# Patient Record
Sex: Male | Born: 1965 | Race: Black or African American | Hispanic: No | Marital: Single | Smoking: Current every day smoker
Health system: Southern US, Community
[De-identification: ages and names within clinical notes are randomized; demographics above are authoritative.]

## PROBLEM LIST (undated history)

## (undated) DIAGNOSIS — E119 Type 2 diabetes mellitus without complications: Secondary | ICD-10-CM

## (undated) DIAGNOSIS — I10 Essential (primary) hypertension: Secondary | ICD-10-CM

---

## 2010-12-27 ENCOUNTER — Emergency Department (HOSPITAL_BASED_OUTPATIENT_CLINIC_OR_DEPARTMENT_OTHER)
Admission: EM | Admit: 2010-12-27 | Discharge: 2010-12-27 | Disposition: A | Discharge status: Home / Self Care | Payer: Worker's Compensation | Attending: Emergency Medicine | Admitting: Emergency Medicine

## 2010-12-27 ENCOUNTER — Emergency Department (INDEPENDENT_AMBULATORY_CARE_PROVIDER_SITE_OTHER): Payer: Worker's Compensation

## 2010-12-27 DIAGNOSIS — S63509A Unspecified sprain of unspecified wrist, initial encounter: Secondary | ICD-10-CM | POA: Insufficient documentation

## 2010-12-27 DIAGNOSIS — I10 Essential (primary) hypertension: Secondary | ICD-10-CM | POA: Insufficient documentation

## 2010-12-27 DIAGNOSIS — F17200 Nicotine dependence, unspecified, uncomplicated: Secondary | ICD-10-CM | POA: Insufficient documentation

## 2010-12-27 DIAGNOSIS — M7989 Other specified soft tissue disorders: Secondary | ICD-10-CM

## 2010-12-27 DIAGNOSIS — M25539 Pain in unspecified wrist: Secondary | ICD-10-CM

## 2010-12-27 DIAGNOSIS — Y9289 Other specified places as the place of occurrence of the external cause: Secondary | ICD-10-CM | POA: Insufficient documentation

## 2010-12-27 DIAGNOSIS — X58XXXA Exposure to other specified factors, initial encounter: Secondary | ICD-10-CM | POA: Insufficient documentation

## 2011-12-04 IMAGING — CR DG WRIST COMPLETE 3+V*R*
4 series · 4 of 4 positions shown · non-contrast
Comparison: None

CLINICAL DATA: Medial pain

RIGHT WRIST - COMPLETE 3+ VIEW

[x wrist pa right]
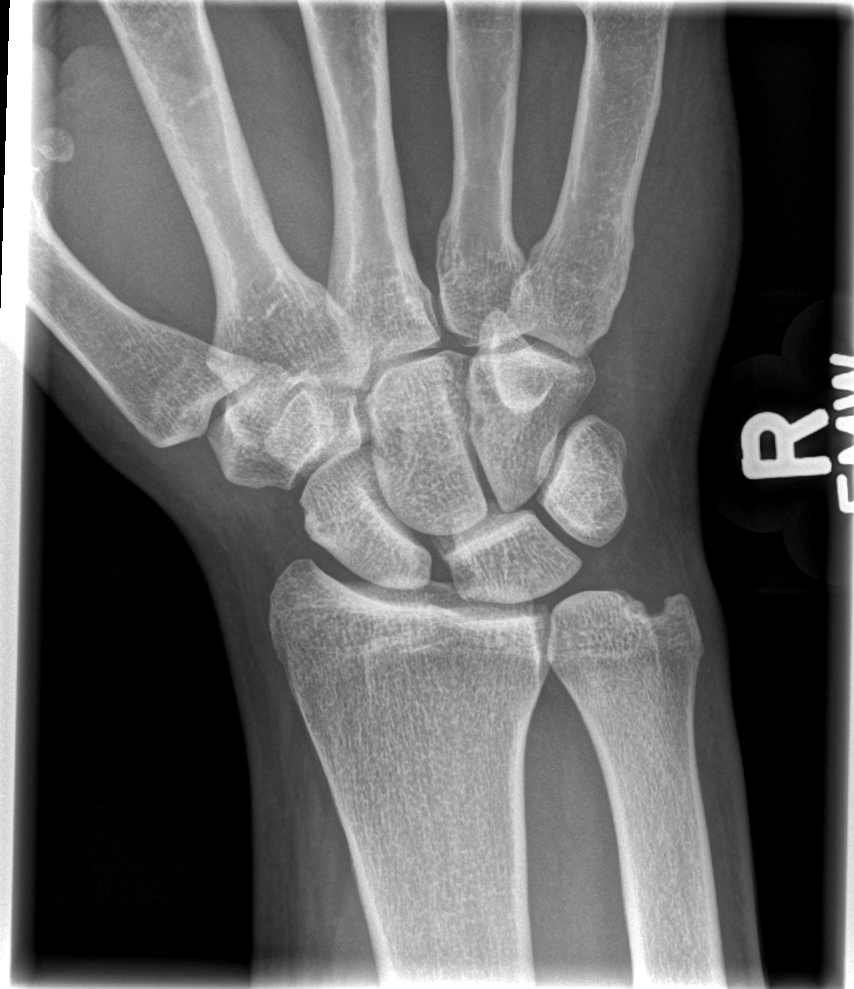

[x wrist obl right]
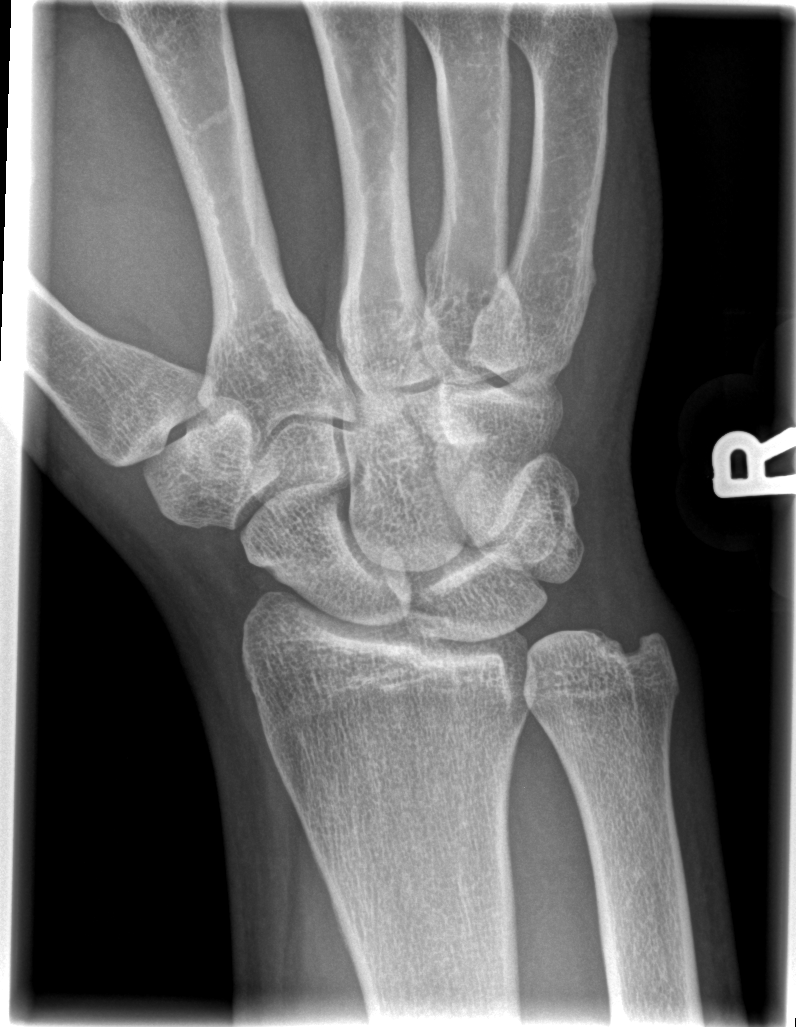

[x wrist lat right]
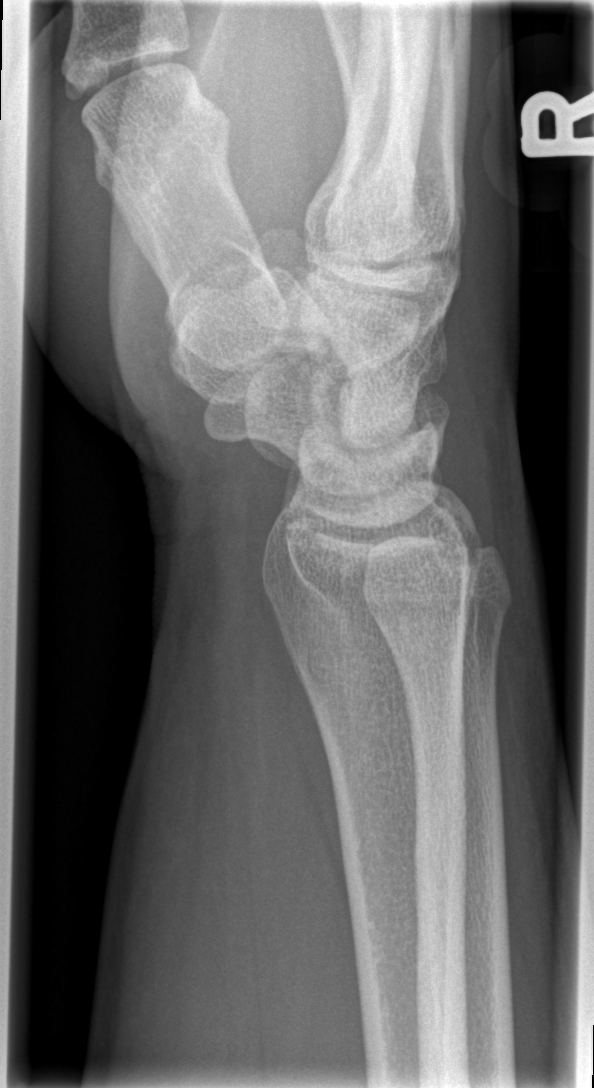

[x navicular]
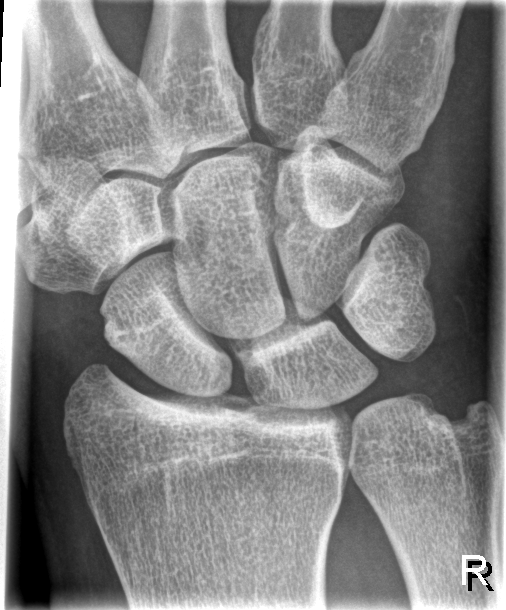

[4 of 4 positions shown; findings below may reference images not displayed]

FINDINGS: No evidence of fracture or dislocation.  There is slight
widening of the space between the lunate and the scaphoid that
could indicate a ligamentous injury.
IMPRESSION: No evidence of fracture.  Question scapholunate ligament tear, age
indeterminate.

## 2016-10-12 ENCOUNTER — Encounter (HOSPITAL_COMMUNITY): Payer: Self-pay | Admitting: *Deleted

## 2016-10-12 ENCOUNTER — Emergency Department (HOSPITAL_COMMUNITY)
Admission: EM | Admit: 2016-10-12 | Discharge: 2016-10-12 | Disposition: A | Discharge status: Home / Self Care | Payer: Worker's Compensation | Attending: Emergency Medicine | Admitting: Emergency Medicine

## 2016-10-12 DIAGNOSIS — E119 Type 2 diabetes mellitus without complications: Secondary | ICD-10-CM | POA: Insufficient documentation

## 2016-10-12 DIAGNOSIS — R030 Elevated blood-pressure reading, without diagnosis of hypertension: Secondary | ICD-10-CM

## 2016-10-12 DIAGNOSIS — I10 Essential (primary) hypertension: Secondary | ICD-10-CM | POA: Insufficient documentation

## 2016-10-12 DIAGNOSIS — F17200 Nicotine dependence, unspecified, uncomplicated: Secondary | ICD-10-CM | POA: Insufficient documentation

## 2016-10-12 DIAGNOSIS — J069 Acute upper respiratory infection, unspecified: Secondary | ICD-10-CM | POA: Insufficient documentation

## 2016-10-12 HISTORY — DX: Essential (primary) hypertension: I10

## 2016-10-12 HISTORY — DX: Type 2 diabetes mellitus without complications: E11.9

## 2016-10-12 NOTE — ED Notes (Signed)
Pt is hypertensive at triage. Has been without his meds x 6 months.

## 2016-10-12 NOTE — ED Notes (Signed)
Patient states he is getting over cold from last week and the sore throat began after he had some drainage.

## 2016-10-12 NOTE — ED Triage Notes (Signed)
Pt reports recent cough and cold symptoms. Now has left side sore throat. Airway intact, no obv swelling noted.

## 2016-10-12 NOTE — ED Provider Notes (Signed)
[facility]-EMERGENCY DEPT Provider Note   By signing my name below, I, Earmon PhoenixJennifer Waddell, attest that this documentation has been prepared under the direction and in the presence of Lorretta HarpFrank Teesha Ohm, PA-C. Electronically Signed: Earmon PhoenixJennifer Waddell, ED Scribe. 10/12/16. 10:39 AM.    History   Chief Complaint Chief Complaint  Patient presents with  . Sore Throat  . Hypertension   The history is provided by the patient and medical records. No language interpreter was used.    Ricky StabsMarty A Hollyfield is a 51 y.o. male with PMHx of DM, HTN who presents to the Emergency Department complaining of sore throat that began two days ago. He states he is having radiation of pain to left ear. He reports associated productive cough of green mucous that is now improving. He has has taken Alka Seltzer Cold & Flu and cough drops for relief. Swallowing increases his pain. He denies alleviating factors. Pt states his granddaughter has been sick with similar symptoms. He denies fever, chills, abdominal pain, nausea, vomiting, difficulty breathing or inability to swallow, CP, drooling. He also presents with apparent hypertension at triage and has been off his BP medications for the last 6 months. Pt has a PCP but does not currently have insurance.   Past Medical History:  Diagnosis Date  . Diabetes mellitus without complication (HCC)   . Hypertension     There are no active problems to display for this patient.   History reviewed. No pertinent surgical history.     Home Medications    Prior to Admission medications   Not on File    Family History History reviewed. No pertinent family history.  Social History Social History  Substance Use Topics  . Smoking status: Current Every Day Smoker  . Smokeless tobacco: Not on file  . Alcohol use Yes     Comment: rarely     Allergies   Patient has no known allergies.   Review of Systems Review of Systems  Constitutional: Negative for chills and fever.  HENT:  Positive for congestion, ear pain and sore throat. Negative for drooling and trouble swallowing.   Respiratory: Positive for cough. Negative for shortness of breath.   Cardiovascular: Negative for chest pain.  Gastrointestinal: Negative for abdominal pain, nausea and vomiting.     Physical Exam Updated Vital Signs BP (!) 194/106 (BP Location: Left Arm) Comment: Pt states has not been taking HTN meds  Pulse 97   Temp 98.8 F (37.1 C) (Oral)   Resp 18   Ht 6\' 2"  (1.88 m)   Wt 218 lb (98.9 kg)   SpO2 98%   BMI 27.99 kg/m   Physical Exam  Constitutional: He is oriented to person, place, and time. He appears well-developed and well-nourished.  Well appearing  HENT:  Head: Normocephalic and atraumatic.  Right Ear: Tympanic membrane and external ear normal.  Left Ear: Tympanic membrane and external ear normal.  Nose: Nose normal.  Mouth/Throat: Oropharynx is clear and moist. No oropharyngeal exudate.  Oropharynx without evidence of redness or exudates. Tonsils without evidence of redness, swelling, or exudates. TM's appear normal with no evidence of bulging. EAC appear non erythematous and not swollen  Eyes: EOM are normal. Pupils are equal, round, and reactive to light.  Neck: Normal range of motion.  Normal ROM. No nuchal rigidity.   Cardiovascular: Normal rate, regular rhythm and normal heart sounds.   Pulmonary/Chest: Effort normal and breath sounds normal. No respiratory distress. He has no wheezes. He has no rales.  Lungs  CTA. No wheezing. No rales. No stridor. Normal work of breathing  Abdominal: Soft. There is no tenderness. There is no rebound and no guarding.  Soft and nontender. No rebound. No guarding. Negative murphy's sign. No focal tenderness at McBurney's point. No CVA tenderness. No evidence of hernia  Neurological: He is alert and oriented to person, place, and time.  Skin: Skin is warm.  Psychiatric: He has a normal mood and affect. His behavior is normal.    Nursing note and vitals reviewed.    ED Treatments / Results  DIAGNOSTIC STUDIES: Oxygen Saturation is 98% on RA, normal by my interpretation.   COORDINATION OF CARE: 10:19 AM- Advised pt that his symptoms are likely viral in nature and he should continue with symptomatic treatment. Will give referral to [facility] for follow up of BP and URI symptoms. Return precautions discussed. Pt verbalizes understanding and agrees to plan.  Medications - No data to display  Labs (all labs ordered are listed, but only abnormal results are displayed) Labs Reviewed - No data to display  EKG  EKG Interpretation None       Radiology No results found.  Procedures Procedures (including critical care time)  Medications Ordered in ED Medications - No data to display   Initial Impression / Assessment and Plan / ED Course  I have reviewed the triage vital signs and the nursing notes.  Pertinent labs & imaging results that were available during my care of the patient were reviewed by me and considered in my medical decision making (see chart for details).     Pt is a 51 y.o. male presents with sore throat and productive cough. On exam, pt in NAD. Afebrile. Lungs CTA, Heart RRR. Abdomen nontender/soft. Patients symptoms are consistent with URI, likely viral etiology. Discussed that antibiotics are not indicated for viral infections. Pt will be discharged with symptomatic treatment.  Patient noted to be hypertensive in the emergency department.  No signs of hypertensive urgency.  Discussed with patient the need for close follow-up and management by their primary care physician. Verbalizes understanding and is agreeable with plan. Pt is hemodynamically stable & in NAD prior to dc. Reasons to immediately return to the emergency department discussed.   I personally performed the services described in this documentation, which was scribed in my presence. The recorded information has  been reviewed and is accurate.  Final Clinical Impressions(s) / ED Diagnoses   Final diagnoses:  Upper respiratory tract infection, unspecified type  Elevated blood pressure reading    New Prescriptions New Prescriptions   No medications on file     [address] [place], [place] 10/12/16 1041    Arby Barrette, MD 10/21/16 [phone]

## 2016-10-12 NOTE — Discharge Instructions (Signed)
1. Treatment: rest, drink plenty of fluids, take tylenol or ibuprofen for fever control 2. Follow Up: Please followup with a primary doctor in 3 days for discussion of your diagnoses and further evaluation after today's visit; if you do not have a primary care doctor use the resource guide provided to find one; Return to the ER for high fevers, difficulty breathing or other concerning symptoms   Contact a health care provider if: You are getting worse rather than better. Your symptoms are not controlled by medicine. You have chills. You have worsening shortness of breath. You have brown or red mucus. You have yellow or brown nasal discharge. You have pain in your face, especially when you bend forward. You have a fever. You have swollen neck glands. You have pain while swallowing. You have white areas in the back of your throat. Get help right away if: You have severe or persistent: Headache. Ear pain. Sinus pain. Chest pain. You have chronic lung disease and any of the following: Wheezing. Prolonged cough. Coughing up blood. A change in your usual mucus. You have a stiff neck. You have changes in your: Vision. Hearing. Thinking. Mood. Contact a health care provider if: You think you are having a reaction to a medicine you are taking. You have headaches that keep coming back (recurring). You feel dizzy. You have swelling in your ankles. You have trouble with your vision. Get help right away if: You develop a severe headache or confusion. You have unusual weakness or numbness. You feel faint. You have severe pain in your chest or abdomen. You vomit repeatedly. You have trouble breathing.

## 2016-10-12 NOTE — ED Notes (Signed)
Patient believes the meds he was on over 6 months ago was lasix and lopressor. States he does not have the money to get them now.

## 2023-07-16 DEATH — deceased
